# Patient Record
Sex: Female | Born: 1960 | Race: Black or African American | Hispanic: No | Marital: Married | Smoking: Former smoker
Health system: Southern US, Community
[De-identification: ages and names within clinical notes are randomized; demographics above are authoritative.]

## PROBLEM LIST (undated history)

## (undated) DIAGNOSIS — Z940 Kidney transplant status: Secondary | ICD-10-CM

## (undated) DIAGNOSIS — N289 Disorder of kidney and ureter, unspecified: Secondary | ICD-10-CM

## (undated) DIAGNOSIS — M1990 Unspecified osteoarthritis, unspecified site: Secondary | ICD-10-CM

## (undated) DIAGNOSIS — M109 Gout, unspecified: Secondary | ICD-10-CM

## (undated) DIAGNOSIS — I10 Essential (primary) hypertension: Secondary | ICD-10-CM

## (undated) DIAGNOSIS — G8929 Other chronic pain: Secondary | ICD-10-CM

## (undated) DIAGNOSIS — M549 Dorsalgia, unspecified: Secondary | ICD-10-CM

## (undated) HISTORY — PX: EYE SURGERY: SHX253

## (undated) HISTORY — PX: OTHER SURGICAL HISTORY: SHX169

## (undated) HISTORY — PX: AV FISTULA PLACEMENT: SHX1204

## (undated) HISTORY — PX: ABDOMINAL HYSTERECTOMY: SHX81

## (undated) HISTORY — PX: KIDNEY SURGERY: SHX687

## (undated) HISTORY — PX: KIDNEY TRANSPLANT: SHX239

---

## 1998-02-24 ENCOUNTER — Emergency Department (HOSPITAL_COMMUNITY): Admission: EM | Admit: 1998-02-24 | Discharge: 1998-02-24 | Payer: Self-pay | Admitting: Emergency Medicine

## 2002-01-13 ENCOUNTER — Encounter: Payer: Self-pay | Admitting: Cardiovascular Disease

## 2002-01-13 ENCOUNTER — Ambulatory Visit (HOSPITAL_COMMUNITY): Admission: RE | Admit: 2002-01-13 | Discharge: 2002-01-13 | Payer: Self-pay | Admitting: Family Medicine

## 2003-11-05 ENCOUNTER — Emergency Department (HOSPITAL_COMMUNITY): Admission: EM | Admit: 2003-11-05 | Discharge: 2003-11-05 | Payer: Self-pay | Admitting: Family Medicine

## 2003-12-11 ENCOUNTER — Encounter: Admission: RE | Admit: 2003-12-11 | Discharge: 2003-12-11 | Payer: Self-pay | Admitting: Family Medicine

## 2006-06-25 ENCOUNTER — Emergency Department (HOSPITAL_COMMUNITY): Admission: EM | Admit: 2006-06-25 | Discharge: 2006-06-25 | Payer: Self-pay | Admitting: Emergency Medicine

## 2008-06-23 ENCOUNTER — Emergency Department (HOSPITAL_COMMUNITY): Admission: EM | Admit: 2008-06-23 | Discharge: 2008-06-23 | Payer: Self-pay | Admitting: Emergency Medicine

## 2011-01-04 ENCOUNTER — Other Ambulatory Visit: Payer: Self-pay | Admitting: Family Medicine

## 2011-01-04 DIAGNOSIS — Z1231 Encounter for screening mammogram for malignant neoplasm of breast: Secondary | ICD-10-CM

## 2011-01-07 ENCOUNTER — Ambulatory Visit
Admission: RE | Admit: 2011-01-07 | Discharge: 2011-01-07 | Disposition: A | Discharge status: Home / Self Care | Payer: Medicare Other | Source: Ambulatory Visit | Attending: Family Medicine | Admitting: Family Medicine

## 2011-01-07 DIAGNOSIS — Z1231 Encounter for screening mammogram for malignant neoplasm of breast: Secondary | ICD-10-CM

## 2013-01-04 ENCOUNTER — Encounter (HOSPITAL_COMMUNITY): Payer: Self-pay | Admitting: *Deleted

## 2013-01-04 ENCOUNTER — Emergency Department (HOSPITAL_COMMUNITY)
Admission: EM | Admit: 2013-01-04 | Discharge: 2013-01-05 | Disposition: A | Discharge status: Home / Self Care | Payer: Medicare Other | Attending: Emergency Medicine | Admitting: Emergency Medicine

## 2013-01-04 ENCOUNTER — Emergency Department (HOSPITAL_COMMUNITY): Payer: Medicare Other

## 2013-01-04 DIAGNOSIS — Y9389 Activity, other specified: Secondary | ICD-10-CM | POA: Insufficient documentation

## 2013-01-04 DIAGNOSIS — S59909A Unspecified injury of unspecified elbow, initial encounter: Secondary | ICD-10-CM | POA: Insufficient documentation

## 2013-01-04 DIAGNOSIS — S6990XA Unspecified injury of unspecified wrist, hand and finger(s), initial encounter: Secondary | ICD-10-CM | POA: Insufficient documentation

## 2013-01-04 DIAGNOSIS — Z79899 Other long term (current) drug therapy: Secondary | ICD-10-CM | POA: Insufficient documentation

## 2013-01-04 DIAGNOSIS — S46909A Unspecified injury of unspecified muscle, fascia and tendon at shoulder and upper arm level, unspecified arm, initial encounter: Secondary | ICD-10-CM | POA: Insufficient documentation

## 2013-01-04 DIAGNOSIS — Z87448 Personal history of other diseases of urinary system: Secondary | ICD-10-CM | POA: Insufficient documentation

## 2013-01-04 DIAGNOSIS — G8929 Other chronic pain: Secondary | ICD-10-CM | POA: Insufficient documentation

## 2013-01-04 DIAGNOSIS — Y92410 Unspecified street and highway as the place of occurrence of the external cause: Secondary | ICD-10-CM | POA: Insufficient documentation

## 2013-01-04 DIAGNOSIS — S4980XA Other specified injuries of shoulder and upper arm, unspecified arm, initial encounter: Secondary | ICD-10-CM | POA: Insufficient documentation

## 2013-01-04 DIAGNOSIS — M549 Dorsalgia, unspecified: Secondary | ICD-10-CM | POA: Insufficient documentation

## 2013-01-04 DIAGNOSIS — I10 Essential (primary) hypertension: Secondary | ICD-10-CM | POA: Insufficient documentation

## 2013-01-04 HISTORY — DX: Other chronic pain: G89.29

## 2013-01-04 HISTORY — DX: Dorsalgia, unspecified: M54.9

## 2013-01-04 HISTORY — DX: Disorder of kidney and ureter, unspecified: N28.9

## 2013-01-04 HISTORY — DX: Essential (primary) hypertension: I10

## 2013-01-04 MED ORDER — IBUPROFEN 800 MG PO TABS
800.0000 mg | ORAL_TABLET | ORAL | Status: AC
  Administered 2013-01-05: 800 mg via ORAL
  Filled 2013-01-04: qty 1

## 2013-01-04 NOTE — ED Notes (Signed)
Patient transported to X-ray 
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
## 2013-02-16 ENCOUNTER — Other Ambulatory Visit: Payer: Self-pay

## 2013-02-16 DIAGNOSIS — Z1231 Encounter for screening mammogram for malignant neoplasm of breast: Secondary | ICD-10-CM

## 2013-02-23 ENCOUNTER — Ambulatory Visit: Payer: Medicare Other

## 2013-02-25 ENCOUNTER — Ambulatory Visit: Payer: Medicare Other

## 2013-03-04 ENCOUNTER — Ambulatory Visit
Admission: RE | Admit: 2013-03-04 | Discharge: 2013-03-04 | Disposition: A | Discharge status: Home / Self Care | Payer: Medicare Other | Source: Ambulatory Visit

## 2013-03-04 DIAGNOSIS — Z1231 Encounter for screening mammogram for malignant neoplasm of breast: Secondary | ICD-10-CM

## 2013-03-08 ENCOUNTER — Other Ambulatory Visit: Payer: Self-pay | Admitting: Family Medicine

## 2013-03-08 DIAGNOSIS — R928 Other abnormal and inconclusive findings on diagnostic imaging of breast: Secondary | ICD-10-CM

## 2013-03-11 ENCOUNTER — Ambulatory Visit
Admission: RE | Admit: 2013-03-11 | Discharge: 2013-03-11 | Disposition: A | Discharge status: Home / Self Care | Payer: Medicare Other | Source: Ambulatory Visit | Attending: Family Medicine | Admitting: Family Medicine

## 2013-03-11 DIAGNOSIS — R928 Other abnormal and inconclusive findings on diagnostic imaging of breast: Secondary | ICD-10-CM

## 2013-09-29 ENCOUNTER — Ambulatory Visit
Admission: RE | Admit: 2013-09-29 | Discharge: 2013-09-29 | Disposition: A | Discharge status: Home / Self Care | Payer: Medicare Other | Source: Ambulatory Visit | Attending: Pain Medicine | Admitting: Pain Medicine

## 2013-09-29 ENCOUNTER — Other Ambulatory Visit: Payer: Self-pay | Admitting: Pain Medicine

## 2013-09-29 DIAGNOSIS — M542 Cervicalgia: Secondary | ICD-10-CM

## 2013-09-29 DIAGNOSIS — M549 Dorsalgia, unspecified: Secondary | ICD-10-CM

## 2014-03-04 ENCOUNTER — Encounter: Payer: Self-pay | Admitting: *Deleted

## 2014-03-22 DIAGNOSIS — M549 Dorsalgia, unspecified: Secondary | ICD-10-CM | POA: Insufficient documentation

## 2015-05-04 ENCOUNTER — Other Ambulatory Visit: Payer: Self-pay | Admitting: Pain Medicine

## 2015-05-04 DIAGNOSIS — M25511 Pain in right shoulder: Secondary | ICD-10-CM

## 2015-06-08 ENCOUNTER — Other Ambulatory Visit: Payer: Medicare Other

## 2015-07-17 ENCOUNTER — Encounter (HOSPITAL_COMMUNITY): Payer: Self-pay | Admitting: Emergency Medicine

## 2015-07-17 ENCOUNTER — Emergency Department (HOSPITAL_COMMUNITY)
Admission: EM | Admit: 2015-07-17 | Discharge: 2015-07-17 | Disposition: A | Discharge status: Home / Self Care | Payer: Medicare Other | Attending: Emergency Medicine | Admitting: Emergency Medicine

## 2015-07-17 DIAGNOSIS — G8929 Other chronic pain: Secondary | ICD-10-CM | POA: Insufficient documentation

## 2015-07-17 DIAGNOSIS — Z87448 Personal history of other diseases of urinary system: Secondary | ICD-10-CM | POA: Insufficient documentation

## 2015-07-17 DIAGNOSIS — Z79899 Other long term (current) drug therapy: Secondary | ICD-10-CM | POA: Diagnosis not present

## 2015-07-17 DIAGNOSIS — M545 Low back pain, unspecified: Secondary | ICD-10-CM

## 2015-07-17 DIAGNOSIS — Z87891 Personal history of nicotine dependence: Secondary | ICD-10-CM | POA: Diagnosis not present

## 2015-07-17 DIAGNOSIS — I10 Essential (primary) hypertension: Secondary | ICD-10-CM | POA: Diagnosis not present

## 2015-07-17 MED ORDER — PREDNISONE 20 MG PO TABS: 60.0000 mg | ORAL_TABLET | Freq: Every day | ORAL | Status: DC

## 2015-07-17 NOTE — ED Provider Notes (Signed)
CSN: [id number]     Arrival date & time 07/17/15  1048 History   First MD Initiated Contact with Patient 07/17/15 1051     Chief Complaint  Patient presents with  . Joint Pain    3 week hx of joint pain     (Consider location/radiation/quality/duration/timing/severity/associated sxs/prior Treatment) HPI Comments: Patient presents today with a chief complaint of lower back pain.  She reports that the pain has been present for the past 10-15 years, but worse over the past 3 weeks.  Pain radiates down the back of both legs.  She reports that the pain is similar to previous pain.  No acute injury or trauma.  She is followed by Pain Management.  She reports that she has been taking Aleve and Oxycontin for the pain without relief.  She denies bowel or bladder incontinence.  Denies numbness, tingling, fever, or chills.  She states that she has an appointment with her PCP in 3 days and with Pain Management next week..  No history of Cancer or IVDU.  The history is provided by the patient.    Past Medical History  Diagnosis Date  . Renal disorder   . Chronic back pain   . Hypertension    Past Surgical History  Procedure Laterality Date  . Av fistula placement      4 years ago  . Kidney surgery      bilateral kidney removal 2 years ago  . Eye surgery    . Abdominal hysterectomy     Family History  Problem Relation Age of Onset  . Cancer Mother   . Hypertension Mother   . Diabetes Mother   . Hypertension Father    Social History  Substance Use Topics  . Smoking status: Former Research scientist (life sciences)  . Smokeless tobacco: None  . Alcohol Use: No   OB History    No data available     Review of Systems  All other systems reviewed and are negative.     Allergies  Shellfish allergy and Contrast media  Home Medications   Prior to Admission medications   Medication Sig Start Date End Date Taking? Authorizing Provider  calcium acetate (PHOSLO) 667 MG capsule Take 3,335 mg by mouth 2 (two)  times daily.     Historical Provider, MD  ferrous sulfate 325 (65 FE) MG EC tablet Take 650 mg by mouth 2 (two) times daily.    Historical Provider, MD  furosemide (LASIX) 40 MG tablet Take 40 mg by mouth daily.    Historical Provider, MD  HYDROcodone-acetaminophen (NORCO) 10-325 MG per tablet Take 1 tablet by mouth every 8 (eight) hours as needed for pain.    Historical Provider, MD  labetalol (NORMODYNE) 200 MG tablet Take 200 mg by mouth 2 (two) times daily.    Historical Provider, MD   BP 104/78 mmHg  Pulse 79  Temp(Src) 98.3 F (36.8 C) (Oral)  Resp 18  Wt 87.091 kg  SpO2 100% Physical Exam  Constitutional: She appears well-developed and well-nourished.  HENT:  Head: Normocephalic and atraumatic.  Mouth/Throat: Oropharynx is clear and moist.  Neck: Normal range of motion. Neck supple.  Cardiovascular: Normal rate, regular rhythm and normal heart sounds.   Pulses:      Dorsalis pedis pulses are 2+ on the right side, and 2+ on the left side.  Pulmonary/Chest: Effort normal and breath sounds normal.  Musculoskeletal: Normal range of motion.       Lumbar back: She exhibits tenderness and bony tenderness.  She exhibits no swelling, no edema and no deformity.  Neurological: She is alert. She has normal strength.  Reflex Scores:      Patellar reflexes are 2+ on the right side and 2+ on the left side. Distal sensation of both feet intact  Skin: Skin is warm and dry.  Psychiatric: She has a normal mood and affect.  Nursing note and vitals reviewed.   ED Course  Procedures (including critical care time) Labs Review Labs Reviewed - No data to display  Imaging Review No results found. I have personally reviewed and evaluated these images and lab results as part of my medical decision-making.   EKG Interpretation None      MDM   Final diagnoses:  None   Patient with chronic back pain.  No acute injury or trauma.  No neurological deficits and normal neuro exam.  Patient can  walk but states is painful.  No loss of bowel or bladder control.  No concern for cauda equina.  No fever, night sweats, weight loss, h/o cancer, IVDU.  Patient stable for discharge.  Patient instructed to follow up with PCP and Pain management as scheduled.  Stable for discharge.  Return precautions given.     Hyman Bible, PA-C 07/17/15 2026  Lacretia Leigh, MD 07/18/15 [phone]

## 2015-07-17 NOTE — ED Notes (Signed)
Pt reports hx of chronic joint pain. Hx of treatment for long term arthritis pain. C/o 3 week hx of increased pain.

## 2017-05-13 ENCOUNTER — Other Ambulatory Visit: Payer: Self-pay | Admitting: Specialist

## 2017-05-13 DIAGNOSIS — Z1231 Encounter for screening mammogram for malignant neoplasm of breast: Secondary | ICD-10-CM

## 2017-05-16 ENCOUNTER — Ambulatory Visit
Admission: RE | Admit: 2017-05-16 | Discharge: 2017-05-16 | Disposition: A | Discharge status: Home / Self Care | Payer: Medicare HMO | Source: Ambulatory Visit | Attending: Specialist | Admitting: Specialist

## 2017-05-16 DIAGNOSIS — Z1231 Encounter for screening mammogram for malignant neoplasm of breast: Secondary | ICD-10-CM

## 2017-05-19 ENCOUNTER — Other Ambulatory Visit: Payer: Self-pay | Admitting: Specialist

## 2017-05-19 DIAGNOSIS — R928 Other abnormal and inconclusive findings on diagnostic imaging of breast: Secondary | ICD-10-CM

## 2017-05-27 ENCOUNTER — Other Ambulatory Visit: Payer: Medicare HMO

## 2017-06-02 ENCOUNTER — Ambulatory Visit
Admission: RE | Admit: 2017-06-02 | Discharge: 2017-06-02 | Disposition: A | Discharge status: Home / Self Care | Payer: Medicare HMO | Source: Ambulatory Visit | Attending: Specialist | Admitting: Specialist

## 2017-06-02 ENCOUNTER — Ambulatory Visit: Payer: Medicare HMO

## 2017-06-02 DIAGNOSIS — R928 Other abnormal and inconclusive findings on diagnostic imaging of breast: Secondary | ICD-10-CM

## 2018-03-19 ENCOUNTER — Encounter: Payer: Self-pay | Admitting: Podiatry

## 2018-03-19 ENCOUNTER — Ambulatory Visit: Payer: Self-pay

## 2018-03-19 ENCOUNTER — Ambulatory Visit: Payer: Medicare HMO | Admitting: Podiatry

## 2018-03-19 VITALS — BP 132/77 | HR 78

## 2018-03-19 DIAGNOSIS — I739 Peripheral vascular disease, unspecified: Secondary | ICD-10-CM | POA: Diagnosis not present

## 2018-03-19 DIAGNOSIS — M79674 Pain in right toe(s): Secondary | ICD-10-CM

## 2018-03-19 DIAGNOSIS — L603 Nail dystrophy: Secondary | ICD-10-CM | POA: Diagnosis not present

## 2018-03-19 DIAGNOSIS — S99921A Unspecified injury of right foot, initial encounter: Secondary | ICD-10-CM

## 2018-03-19 MED ORDER — CEPHALEXIN 500 MG PO CAPS: 500.0000 mg | ORAL_CAPSULE | Freq: Three times a day (TID) | ORAL | 0 refills | Status: DC

## 2018-03-19 MED ORDER — DOXYCYCLINE HYCLATE 100 MG PO TABS: 100.0000 mg | ORAL_TABLET | Freq: Two times a day (BID) | ORAL | 0 refills | Status: DC

## 2018-03-19 NOTE — Patient Instructions (Signed)

## 2018-03-23 NOTE — Progress Notes (Signed)
Subjective:   Patient ID: Lynn Gregory, female   DOB: 57 y.o.   MRN: [id number]   HPI 57 year old female presents the office today for concerns of her right big toenail becoming very thickened discolored and the area is painful she is asking for the nail removed currently.  She states this started after she dropped a can on her toe several years ago and the nail has come back and thickened discolored.  She denies any redness or drainage or any swelling to the toenail site.  She has no other concerns.   Review of Systems  All other systems reviewed and are negative.  Past Medical History:  Diagnosis Date  . Chronic back pain   . Hypertension   . Renal disorder     Past Surgical History:  Procedure Laterality Date  . ABDOMINAL HYSTERECTOMY    . AV FISTULA PLACEMENT     4 years ago  . EYE SURGERY    . KIDNEY SURGERY     bilateral kidney removal 2 years ago     Current Outpatient Medications:  .  acetaminophen (TYLENOL) 500 MG tablet, Take by mouth., Disp: , Rfl:  .  amitriptyline (ELAVIL) 10 MG tablet, , Disp: , Rfl:  .  amLODipine (NORVASC) 10 MG tablet, , Disp: , Rfl:  .  aspirin EC 81 MG tablet, Take by mouth., Disp: , Rfl:  .  calcium acetate (PHOSLO) 667 MG capsule, Take 3,335 mg by mouth 2 (two) times daily. , Disp: , Rfl:  .  carvedilol (COREG) 25 MG tablet, , Disp: , Rfl:  .  cloNIDine (CATAPRES) 0.2 MG tablet, , Disp: , Rfl:  .  diclofenac sodium (VOLTAREN) 1 % GEL, APPLY 2 4 GRAMS TO PAINFUL AREA OF SKIN 2 4 TIMES PER DAY IF TOLERATED, Disp: , Rfl: 0 .  DOCUSATE SODIUM PO, Take by mouth., Disp: , Rfl:  .  DULoxetine (CYMBALTA) 30 MG capsule, , Disp: , Rfl:  .  ferrous sulfate 325 (65 FE) MG tablet, Take by mouth., Disp: , Rfl:  .  furosemide (LASIX) 40 MG tablet, Take by mouth., Disp: , Rfl:  .  HYDROcodone-acetaminophen (NORCO) 10-325 MG tablet, Take by mouth., Disp: , Rfl:  .  ipratropium (ATROVENT) 0.03 % nasal spray, USE 2 SPRAY(S) IN EACH NOSTRIL TWICE DAILY,  Disp: , Rfl: 6 .  iron polysaccharides (NIFEREX) 150 MG capsule, Take by mouth., Disp: , Rfl:  .  labetalol (NORMODYNE) 300 MG tablet, Take 600 mg by mouth 2 (two) times daily., Disp: , Rfl: 3 .  magnesium oxide (MAG-OX) 400 (241.3 Mg) MG tablet, Take 1 tablet by mouth daily., Disp: , Rfl: 2 .  metoprolol succinate (TOPROL-XL) 50 MG 24 hr tablet, Take by mouth., Disp: , Rfl:  .  mycophenolate (MYFORTIC) 180 MG EC tablet, Take 720 mg by mouth 2 (two) times daily., Disp: , Rfl: 5 .  Oxycodone HCl 10 MG TABS, , Disp: , Rfl:  .  oxyCODONE-acetaminophen (PERCOCET) 10-325 MG tablet, , Disp: , Rfl:  .  potassium chloride (MICRO-K) 10 MEQ CR capsule, Take by mouth., Disp: , Rfl:  .  predniSONE (DELTASONE) 5 MG tablet, Take by mouth., Disp: , Rfl:  .  pyridOXINE (VITAMIN B-6) 100 MG tablet, Take by mouth., Disp: , Rfl:  .  sevelamer carbonate (RENVELA) 800 MG tablet, 1 tab by mouth 3 times a day with meals-1200mg  tabs, Disp: , Rfl:  .  sodium bicarbonate 325 MG tablet, Take by mouth., Disp: , Rfl:  .  sulfamethoxazole-trimethoprim (BACTRIM,SEPTRA) 400-80 MG tablet, TAKE 1 TABLET BY MOUTH ON MONDAY WEDNESDAY AND FRIDAY, Disp: , Rfl: 5 .  tacrolimus (PROGRAF) 1 MG capsule, TAKE 2 CAPSULES BY MOUTH IN THE AM AND TAKE 3 CAPSULES BY MOUTH IN THE PM, Disp: , Rfl: 5 .  doxycycline (VIBRA-TABS) 100 MG tablet, Take 1 tablet (100 mg total) by mouth 2 (two) times daily., Disp: 20 tablet, Rfl: 0  Allergies  Allergen Reactions  . Iodinated Diagnostic Agents Itching, Rash and Swelling  . Other Itching, Rash and Swelling    Rash itching with x ray dye, and IV dye Patient husband states patient is allergic to all dyes.   . Shellfish Allergy Other (See Comments) and Swelling    Lower extremities swelling  . Metrizamide Itching         Objective:  Physical Exam  General: AAO x3, NAD  Dermatological: The right hallux toenail significantly hypertrophic, dystrophic ill-defined discoloration.  There is  tenderness the entire toenail.  No surrounding erythema, ascending cellulitis.  There is no fluctuation or crepitation or any malodor.  No open lesions otherwise.  Vascular: Dorsalis Pedis artery and Posterior Tibial artery pedal pulses are 2/4 bilateral with immedate capillary fill time.  There is no pain with calf compression, swelling, warmth, erythema.   Neruologic: Grossly intact via light touch bilateral.  Protective threshold with Semmes Wienstein monofilament intact to all pedal sites bilateral.   Musculoskeletal: No gross boney pedal deformities bilateral. No pain, crepitus, or limitation noted with foot and ankle range of motion bilateral. Muscular strength 5/5 in all groups tested bilateral.  Gait: Unassisted, Nonantalgic.      Assessment:   Assessment right hallux onychodystrophy     Plan:  -Treatment options discussed including all alternatives, risks, and complications -Etiology of symptoms were discussed -I did an ABI in the office to ensure healing.  Left was 1.26 and the right was 1.24. -At this time, the patient is requesting total nail removal with chemical matricectomy to the nail. Risks and complications were discussed with the patient for which they understand and written consent was obtained. Under sterile conditions a total of 3 mL of a mixture of 2% lidocaine plain and 0.5% Marcaine plain was infiltrated in a hallux block fashion. Once anesthetized, the skin was prepped in sterile fashion. A tourniquet was then applied. Next the right hallux toenail was then sharply excised making sure to remove the entire offending nail border. Once the nails were ensured to be removed area was debrided and the underlying skin was intact. There is no purulence identified in the procedure. Next phenol was then applied under standard conditions and copiously irrigated. Silvadene was applied. A dry sterile dressing was applied. After application of the dressing the tourniquet was removed  and there is found to be an immediate capillary refill time to the digit. The patient tolerated the procedure well any complications. Post procedure instructions were discussed the patient for which he verbally understood. Follow-up in one week for nail check or sooner if any problems are to arise. Discussed signs/symptoms of infection and directed to call the office immediately should any occur or go directly to the emergency room. In the meantime, encouraged to call the office with any questions, concerns, changes symptoms. -Doxycycline  Trula Slade DPM

## 2018-03-26 ENCOUNTER — Ambulatory Visit: Payer: Self-pay

## 2018-03-26 DIAGNOSIS — L603 Nail dystrophy: Secondary | ICD-10-CM

## 2018-03-26 NOTE — Patient Instructions (Signed)

## 2018-03-27 NOTE — Progress Notes (Signed)
Patient is here today for follow-up appointment, recent procedure performed 03/19/2018, removal of right hallux nail.  She says that overall her toe feels good, she continues to soak up the Epson salts burns at times.  No redness, no erythema, very small amount of serosanguineous drainage.  No swelling no other indications of infection.  Overall area appears to be healing well.  Advised to continue soaking toe, continue to bandage during the day, and take bandage off at night.  We also discussed signs and symptoms of infection and importance of reporting signs immediately.  Verbal and written instructions were given to the patient.  She is to follow-up with any acute symptom changes.

## 2018-04-17 ENCOUNTER — Other Ambulatory Visit: Payer: Self-pay | Admitting: Podiatry

## 2018-09-17 ENCOUNTER — Other Ambulatory Visit: Payer: Self-pay | Admitting: Specialist

## 2018-09-17 DIAGNOSIS — Z1231 Encounter for screening mammogram for malignant neoplasm of breast: Secondary | ICD-10-CM

## 2018-10-23 ENCOUNTER — Ambulatory Visit: Payer: Medicare HMO

## 2018-12-08 ENCOUNTER — Other Ambulatory Visit: Payer: Self-pay

## 2018-12-08 ENCOUNTER — Ambulatory Visit
Admission: RE | Admit: 2018-12-08 | Discharge: 2018-12-08 | Disposition: A | Discharge status: Home / Self Care | Payer: Medicare HMO | Source: Ambulatory Visit | Attending: Specialist | Admitting: Specialist

## 2018-12-08 DIAGNOSIS — Z1231 Encounter for screening mammogram for malignant neoplasm of breast: Secondary | ICD-10-CM

## 2018-12-09 ENCOUNTER — Other Ambulatory Visit: Payer: Self-pay | Admitting: Specialist

## 2018-12-09 DIAGNOSIS — R928 Other abnormal and inconclusive findings on diagnostic imaging of breast: Secondary | ICD-10-CM

## 2018-12-14 ENCOUNTER — Other Ambulatory Visit: Payer: Self-pay

## 2018-12-14 ENCOUNTER — Other Ambulatory Visit: Payer: Self-pay | Admitting: Family

## 2018-12-14 ENCOUNTER — Ambulatory Visit: Payer: Medicare HMO

## 2018-12-14 ENCOUNTER — Ambulatory Visit
Admission: RE | Admit: 2018-12-14 | Discharge: 2018-12-14 | Disposition: A | Discharge status: Home / Self Care | Payer: Medicare HMO | Source: Ambulatory Visit | Attending: Specialist | Admitting: Specialist

## 2018-12-14 DIAGNOSIS — R928 Other abnormal and inconclusive findings on diagnostic imaging of breast: Secondary | ICD-10-CM

## 2019-01-05 ENCOUNTER — Other Ambulatory Visit: Payer: Self-pay | Admitting: Surgery

## 2019-01-20 NOTE — Progress Notes (Signed)
[place], [place] - [address] [place] [address] [place] [address] Phone: [phone] Fax: [phone]  [facility] [address] [place] [address], [place] [address] Phone: [phone] Fax: [phone]  [place], [place] [address] Phone: [phone] Fax: [phone]      Your procedure is scheduled on July 29  Report to [facility] Main Entrance "A" at 1100 A.M., and check in at the Admitting office.  Call this number if you have problems the morning of surgery:  [phone]  Call [phone] if you have any questions prior to your surgery date Monday-Friday 8am-4pm    Remember:  Do not eat after midnight the night before your surgery  You may drink clear liquids until 1000 am the morning of your surgery.   Clear liquids allowed are: Water, Non-Citrus Juices (without pulp), Carbonated Beverages, Clear Tea, Black Coffee Only, and Gatorade    Take these medicines the morning of surgery with A SIP OF WATER  acetaminophen (TYLENOL)  If needed amitriptyline (ELAVIL) amLODipine (NORVASC) carvedilol (COREG)  cloNIDine (CATAPRES) doxycycline (VIBRA-TABS)  DULoxetine (CYMBALTA) HYDROcodone-acetaminophen (NORCO)  If needed ipratropium (ATROVENT) labetalol (NORMODYNE) metoprolol succinate (TOPROL-XL)  mycophenolate (MYFORTIC)  tacrolimus (PROGRAF)  Follow your surgeon's instructions on when to stop Aspirin.  If no instructions were given by your surgeon then you will need to call the office to get those instructions.     7 days prior to surgery STOP taking any Aspirin (unless otherwise instructed by your surgeon), Aleve, Naproxen, Ibuprofen, Motrin, Advil, Goody's, BC's, all herbal medications, fish oil, and all vitamins.    The Morning of Surgery  Do not wear jewelry, make-up or nail polish.  Do  not wear lotions, powders, or perfumes/colognes, or deodorant  Do not shave 48 hours prior to surgery.   Do not bring valuables to the hospital.  [facility] is not responsible for any belongings or valuables.  If you are a smoker, DO NOT Smoke 24 hours prior to surgery IF you wear a CPAP at night please bring your mask, tubing, and machine the morning of surgery   Remember that you must have someone to transport you home after your surgery, and remain with you for 24 hours if you are discharged the same day.   Contacts, glasses, hearing aids, dentures or bridgework may not be worn into surgery.    Leave your suitcase in the car.  After surgery it may be brought to your room.  For patients admitted to the hospital, discharge time will be determined by your treatment team.  Patients discharged the day of surgery will not be allowed to drive home.    Special instructions:   [place]- Preparing For Surgery  Before surgery, you can play an important role. Because skin is not sterile, your skin needs to be as free of germs as possible. You can reduce the number of germs on your skin by washing with CHG (chlorahexidine gluconate) Soap before surgery.  CHG is an antiseptic cleaner which kills germs and bonds with the skin to continue killing germs even after washing.    Oral Hygiene is also important to reduce your risk of infection.  Remember - BRUSH YOUR TEETH THE MORNING OF SURGERY WITH YOUR REGULAR TOOTHPASTE  Please do not use if you have an allergy to CHG or antibacterial soaps. If your skin becomes reddened/irritated stop  using the CHG.  Do not shave (including legs and underarms) for at least 48 hours prior to first CHG shower. It is OK to shave your face.  Please follow these instructions carefully.   1. Shower the NIGHT BEFORE SURGERY and the MORNING OF SURGERY with CHG Soap.   2. If you chose to wash your hair, wash your hair first as usual with your normal  shampoo.  3. After you shampoo, rinse your hair and body thoroughly to remove the shampoo.  4. Use CHG as you would any other liquid soap. You can apply CHG directly to the skin and wash gently with a scrungie or a clean washcloth.   5. Apply the CHG Soap to your body ONLY FROM THE NECK DOWN.  Do not use on open wounds or open sores. Avoid contact with your eyes, ears, mouth and genitals (private parts). Wash Face and genitals (private parts)  with your normal soap.   6. Wash thoroughly, paying special attention to the area where your surgery will be performed.  7. Thoroughly rinse your body with warm water from the neck down.  8. DO NOT shower/wash with your normal soap after using and rinsing off the CHG Soap.  9. Pat yourself dry with a CLEAN TOWEL.  10. Wear CLEAN PAJAMAS to bed the night before surgery, wear comfortable clothes the morning of surgery  11. Place CLEAN SHEETS on your bed the night of your first shower and DO NOT SLEEP WITH PETS.    Day of Surgery:  Do not apply any deodorants/lotions. Please shower the morning of surgery with the CHG soap  Please wear clean clothes to the hospital/surgery center.   Remember to brush your teeth WITH YOUR REGULAR TOOTHPASTE.   Please read over the following fact sheets that you were given.

## 2019-01-21 ENCOUNTER — Encounter (HOSPITAL_COMMUNITY): Payer: Self-pay

## 2019-01-21 ENCOUNTER — Encounter (HOSPITAL_COMMUNITY)
Admission: RE | Admit: 2019-01-21 | Discharge: 2019-01-21 | Disposition: A | Discharge status: Home / Self Care | Payer: Medicare HMO | Source: Ambulatory Visit | Attending: Orthopaedic Surgery | Admitting: Orthopaedic Surgery

## 2019-01-21 ENCOUNTER — Other Ambulatory Visit: Payer: Self-pay

## 2019-01-21 DIAGNOSIS — Z01818 Encounter for other preprocedural examination: Secondary | ICD-10-CM | POA: Insufficient documentation

## 2019-01-21 HISTORY — DX: Unspecified osteoarthritis, unspecified site: M19.90

## 2019-01-21 HISTORY — DX: Gout, unspecified: M10.9

## 2019-01-21 HISTORY — DX: Kidney transplant status: Z94.0

## 2019-01-21 LAB — BASIC METABOLIC PANEL
Anion gap: 10 (ref 5–15)
BUN: 17 mg/dL (ref 6–20)
CO2: 27 mmol/L (ref 22–32)
Calcium: 9.8 mg/dL (ref 8.9–10.3)
Chloride: 103 mmol/L (ref 98–111)
Creatinine, Ser: 1.21 mg/dL — ABNORMAL HIGH (ref 0.44–1.00)
GFR calc Af Amer: 57 mL/min — ABNORMAL LOW (ref >60)
GFR calc non Af Amer: 49 mL/min — ABNORMAL LOW (ref >60)
Glucose, Bld: 122 mg/dL — ABNORMAL HIGH (ref 70–99)
Potassium: 4.3 mmol/L (ref 3.5–5.1)
Sodium: 140 mmol/L (ref 135–145)

## 2019-01-21 LAB — CBC
HCT: 38.2 % (ref 36.0–46.0)
Hemoglobin: 12.9 g/dL (ref 12.0–15.0)
MCH: 29.8 pg (ref 26.0–34.0)
MCHC: 33.8 g/dL (ref 30.0–36.0)
MCV: 88.2 fL (ref 80.0–100.0)
Platelets: 291 10*3/uL (ref 150–400)
RBC: 4.33 MIL/uL (ref 3.87–5.11)
RDW: 14.2 % (ref 11.5–15.5)
WBC: 4.8 10*3/uL (ref 4.0–10.5)
nRBC: 0 % (ref 0.0–0.2)

## 2019-01-21 NOTE — Progress Notes (Signed)
PCP - Lillia Corporal Cardiologist - denies  Chest x-ray - not needed EKG - 01/21/19 Stress Test - 2017 ECHO - 2017 Cardiac Cath - denies  Aspirin Instructions: patient was told to continue asa  Anesthesia review: yes, hx kidney transplant  Patient denies shortness of breath, fever, cough and chest pain at PAT appointment   Patient verbalized understanding of instructions that were given to them at the PAT appointment. Patient was also instructed that they will need to review over the PAT instructions again at home before surgery.

## 2019-01-22 NOTE — Anesthesia Preprocedure Evaluation (Addendum)
Anesthesia Evaluation  Patient identified by MRN, date of birth, ID band Patient awake    Reviewed: Allergy & Precautions, NPO status , Patient's Chart, lab work & pertinent test results  Airway Mallampati: II  TM Distance: >3 FB Neck ROM: Full    Dental  (+) Partial Upper, Teeth Intact, Dental Advisory Given   Pulmonary former smoker,    breath sounds clear to auscultation       Cardiovascular hypertension,  Rhythm:Regular Rate:Normal     Neuro/Psych    GI/Hepatic   Endo/Other    Renal/GU      Musculoskeletal   Abdominal   Peds  Hematology   Anesthesia Other Findings   Reproductive/Obstetrics                            Anesthesia Physical Anesthesia Plan  ASA: III  Anesthesia Plan: MAC   Post-op Pain Management:    Induction: Intravenous  PONV Risk Score and Plan: Ondansetron and Dexamethasone  Airway Management Planned: Natural Airway and Simple Face Mask  Additional Equipment:   Intra-op Plan:   Post-operative Plan:   Informed Consent: I have reviewed the patients History and Physical, chart, labs and discussed the procedure including the risks, benefits and alternatives for the proposed anesthesia with the patient or authorized representative who has indicated his/her understanding and acceptance.     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 Baseline creatinine now 1.1-1.2.  Stress echo 09/14/15 (care everywhere): SUMMARY The patient had no chest pain during stress The patient achieved 92 % of maximum predicted heart rate. Normal left ventricular function at rest. There were no segmental wall motion abnormalities at rest. There was normal augmentation of all left ventricular  wall segments with  dobutamine. There were no segmental wall motion abnormalities with dobutamine. Negative stress ECG for inducible ischemia at target heart rate. Negative dobutamine echocardiography for inducible ischemia at target heart  rate.  TTE 08/17/15 (care everywhere):  Left ventricular systolic function is normal. LV ejection fraction = 60-65%. The left ventricular wall motion is normal.  Left ventricular filling pattern is normal. The right ventricle is normal in size and function. The left atrium is mildly dilated. No significant valvular stenosis or regurgitation There is no pericardial effusion. Probably no significant change in comparison with the prior study noted)       Anesthesia Quick Evaluation

## 2019-01-23 ENCOUNTER — Other Ambulatory Visit (HOSPITAL_COMMUNITY): Payer: Medicare HMO

## 2019-01-25 ENCOUNTER — Other Ambulatory Visit (HOSPITAL_COMMUNITY)
Admission: RE | Admit: 2019-01-25 | Discharge: 2019-01-25 | Disposition: A | Discharge status: Home / Self Care | Payer: Medicare HMO | Source: Ambulatory Visit | Attending: Surgery | Admitting: Surgery

## 2019-01-25 DIAGNOSIS — Z20828 Contact with and (suspected) exposure to other viral communicable diseases: Secondary | ICD-10-CM | POA: Diagnosis present

## 2019-01-26 LAB — SARS CORONAVIRUS 2 (TAT 6-24 HRS): SARS Coronavirus 2: NEGATIVE

## 2019-01-26 NOTE — H&P (Signed)
Lynn Gregory Documented: 01/05/2019 2:20 PM Location: [facility] Patient #: [phone] DOB: May 04, 1961 Single / Language: Lynn Gregory / Race: Black or African American Female   History of Present Illness (Lynn Gregory A. Ninfa Linden MD; 01/05/2019 2:50 PM) The patient is a 58 year old female who presents with a complaint of Mass. This is a pleasant 58 year old female referred by Lynn Presser FNP for evaluation of a mass on her back. The patient reports the mass of the neck for several years but now continues to get larger and causes discomfort especially when trying to sleep. She has a history of renal failure and has had a kidney transplant and is doing very well from this. She is otherwise without complaints.   Past Surgical History Lynn Gregory, [place]; 01/05/2019 2:20 PM) Breast Biopsy  Bilateral. Dialysis Shunt / Fistula  Nephrectomy  Bilateral.  Diagnostic Studies History Lynn Gregory, Lynn Gregory; 01/05/2019 2:20 PM) Colonoscopy  1-5 years ago Mammogram  1-3 years ago Pap Smear  1-5 years ago  Allergies Lynn Gregory, Lynn Gregory; 01/05/2019 2:20 PM) No Known Allergies  [01/05/2019]: No Known Drug Allergies  [01/05/2019]: Allergies Reconciled   Medication History Lynn Gregory, Lynn Gregory; 01/05/2019 2:24 PM) Aspirin (81MG  Tablet DR, Oral) Active. Calcium 600 (1500 (600 Ca)MG Tablet, Oral) Active. Pyridium (100MG  Tablet, Oral) Active. Doxycycline (40MG  Capsule DR, Oral) Active. Amitriptyline HCl (10MG  Tablet, Oral) Active. Cymbalta (60MG  Capsule DR Part, Oral) Active. Labetalol HCl (100MG  Tablet, Oral) Active. Magnesium (30MG  Tablet, Oral) Active. Prograf (1MG  Packet, Oral) Active. Diclofenac Potassium (50MG  Tablet, Oral) Active. Atrovent HFA (17MCG/ACT Aerosol Soln, Inhalation) Active. Potassium (75MG  Tablet, Oral) Active. Medications Reconciled  Social History ([place], Lynn Gregory; 01/05/2019 2:20 PM) Alcohol use  Occasional alcohol use. Caffeine use  Carbonated  beverages, Coffee, Tea. Illicit drug use  Prefer to discuss with provider. Tobacco use  Former smoker.  Family History Lynn Gregory, [place]; 01/05/2019 2:20 PM) Alcohol Abuse  Brother. Arthritis  Mother. Cerebrovascular Accident  Brother. Cervical Cancer  Mother. Depression  Mother. Diabetes Mellitus  Mother. Heart Disease  Sister. Heart disease in female family member before age 93  Heart disease in female family member before age 39  Hypertension  Brother, Father, Mother, Sister, Son. Kidney Disease  Sister.  Pregnancy / Birth History Lynn Gregory, [place]; 01/05/2019 2:20 PM) Age at menarche  48 years. Irregular periods  Maternal age  37-20 Para  1  Other Problems Lynn Gregory, Lynn Gregory; 01/05/2019 2:20 PM) Arthritis  Back Pain  Chronic Renal Failure Syndrome  High blood pressure  Transfusion history     Review of Systems (Lynn Gregory; 01/05/2019 2:20 PM) General Present- Chills, Night Sweats and Weight Gain. Not Present- Appetite Loss, Fatigue, Fever and Weight Loss. Skin Present- Dryness. Not Present- Change in Wart/Mole, Hives, Jaundice, New Lesions, Non-Healing Wounds, Rash and Ulcer. HEENT Present- Nose Bleed, Ringing in the Ears, Seasonal Allergies and Wears glasses/contact lenses. Not Present- Earache, Hearing Loss, Hoarseness, Oral Ulcers, Sinus Pain, Sore Throat, Visual Disturbances and Yellow Eyes. Cardiovascular Present- Leg Cramps and Swelling of Extremities. Not Present- Chest Pain, Difficulty Breathing Lying Down, Palpitations, Rapid Heart Rate and Shortness of Breath. Gastrointestinal Present- Bloating, Nausea and Vomiting. Not Present- Abdominal Pain, Bloody Stool, Change in Bowel Habits, Chronic diarrhea, Constipation, Difficulty Swallowing, Excessive gas, Gets full quickly at meals, Hemorrhoids, Indigestion and Rectal Pain. Female Genitourinary Present- Frequency and Nocturia. Not Present- Painful Urination, Pelvic Pain and  Urgency. Musculoskeletal Present- Back Pain, Muscle Pain and Swelling of Extremities. Not Present- Joint Pain, Joint Stiffness and Muscle  Weakness. Psychiatric Present- Anxiety and Change in Sleep Pattern. Not Present- Bipolar, Depression, Fearful and Frequent crying. Endocrine Present- Hair Changes and Hot flashes. Not Present- Cold Intolerance, Excessive Hunger, Heat Intolerance and New Diabetes.  Vitals (Lynn Gregory Lynn Gregory; 01/05/2019 2:25 PM) 01/05/2019 2:24 PM Weight: 186 lb Height: 63in Body Surface Area: 1.88 m Body Mass Index: 32.95 kg/m  Temp.: 98.12F (Oral)  Pulse: 93 (Regular)  BP: 136/82(Sitting, Left Arm, Standard)       Physical Exam (Lynn Geno A. Ninfa Linden MD; 01/05/2019 2:50 PM) The physical exam findings are as follows: Note: On examination, she is well appearance Lungs are clear bilaterally Cardiovascular is regular rate and rhythm There is a large 7 cm mass on her upper mid back almost at the midline which is soft but mildly tender    Assessment & Plan (Lynn Gregory A. Ninfa Linden MD; 01/05/2019 2:51 PM)  SUBCUTANEOUS MASS OF BACK (R22.2) Impression: This is quite large mass that is tender. I spent a lipoma although I cannot rule out a desmoid tumor. Surgical excision of the mass is recommended to rule out malignancy. I discussed this procedure with her in detail. This would need to be performed in the operating room. I discussed the risks of surgery which included but is not limited to bleeding, infection, recurrence, injury to surrounding structures, cardiopulmonary issues, etc. She understands and wishes to proceed with surgery which will be scheduled

## 2019-01-27 ENCOUNTER — Other Ambulatory Visit: Payer: Self-pay

## 2019-01-27 ENCOUNTER — Ambulatory Visit (HOSPITAL_COMMUNITY): Payer: Medicare HMO | Admitting: Certified Registered Nurse Anesthetist

## 2019-01-27 ENCOUNTER — Encounter (HOSPITAL_COMMUNITY): Payer: Self-pay | Admitting: Certified Registered Nurse Anesthetist

## 2019-01-27 ENCOUNTER — Encounter (HOSPITAL_COMMUNITY)
Admission: RE | Disposition: A | Discharge status: Home / Self Care | Payer: Self-pay | Source: Home / Self Care | Attending: Surgery

## 2019-01-27 ENCOUNTER — Ambulatory Visit (HOSPITAL_COMMUNITY): Payer: Medicare HMO | Admitting: Physician Assistant

## 2019-01-27 ENCOUNTER — Ambulatory Visit (HOSPITAL_COMMUNITY)
Admission: RE | Admit: 2019-01-27 | Discharge: 2019-01-27 | Disposition: A | Discharge status: Home / Self Care | Payer: Medicare HMO | Attending: Surgery | Admitting: Surgery

## 2019-01-27 DIAGNOSIS — Z79899 Other long term (current) drug therapy: Secondary | ICD-10-CM | POA: Diagnosis not present

## 2019-01-27 DIAGNOSIS — Z940 Kidney transplant status: Secondary | ICD-10-CM | POA: Diagnosis not present

## 2019-01-27 DIAGNOSIS — D171 Benign lipomatous neoplasm of skin and subcutaneous tissue of trunk: Secondary | ICD-10-CM | POA: Diagnosis not present

## 2019-01-27 DIAGNOSIS — N189 Chronic kidney disease, unspecified: Secondary | ICD-10-CM | POA: Diagnosis not present

## 2019-01-27 DIAGNOSIS — Z905 Acquired absence of kidney: Secondary | ICD-10-CM | POA: Diagnosis not present

## 2019-01-27 DIAGNOSIS — I129 Hypertensive chronic kidney disease with stage 1 through stage 4 chronic kidney disease, or unspecified chronic kidney disease: Secondary | ICD-10-CM | POA: Diagnosis not present

## 2019-01-27 DIAGNOSIS — Z7982 Long term (current) use of aspirin: Secondary | ICD-10-CM | POA: Insufficient documentation

## 2019-01-27 DIAGNOSIS — Z87891 Personal history of nicotine dependence: Secondary | ICD-10-CM | POA: Insufficient documentation

## 2019-01-27 DIAGNOSIS — R222 Localized swelling, mass and lump, trunk: Secondary | ICD-10-CM | POA: Diagnosis present

## 2019-01-27 HISTORY — PX: MASS EXCISION: SHX2000

## 2019-01-27 SURGERY — EXCISION MASS
Anesthesia: Monitor Anesthesia Care | Site: Back

## 2019-01-27 MED ORDER — FENTANYL CITRATE (PF) 100 MCG/2ML IJ SOLN
INTRAMUSCULAR | Status: DC | PRN
  Administered 2019-01-27: 50 ug via INTRAVENOUS
  Administered 2019-01-27 (×2): 25 ug via INTRAVENOUS

## 2019-01-27 MED ORDER — SUGAMMADEX SODIUM 500 MG/5ML IV SOLN
INTRAVENOUS | Status: AC
  Filled 2019-01-27: qty 5

## 2019-01-27 MED ORDER — CHLORHEXIDINE GLUCONATE CLOTH 2 % EX PADS: 6.0000 | MEDICATED_PAD | Freq: Once | CUTANEOUS | Status: DC

## 2019-01-27 MED ORDER — BUPIVACAINE-EPINEPHRINE 0.25% -1:200000 IJ SOLN
INTRAMUSCULAR | Status: DC | PRN
  Administered 2019-01-27: 30 mL

## 2019-01-27 MED ORDER — ONDANSETRON HCL 4 MG/2ML IJ SOLN: 4.0000 mg | Freq: Once | INTRAMUSCULAR | Status: DC | PRN

## 2019-01-27 MED ORDER — DEXAMETHASONE SODIUM PHOSPHATE 10 MG/ML IJ SOLN
INTRAMUSCULAR | Status: AC
  Filled 2019-01-27: qty 1

## 2019-01-27 MED ORDER — ACETAMINOPHEN 500 MG PO TABS
1000.0000 mg | ORAL_TABLET | ORAL | Status: AC
  Administered 2019-01-27: 1000 mg via ORAL
  Filled 2019-01-27: qty 2

## 2019-01-27 MED ORDER — ONDANSETRON HCL 4 MG/2ML IJ SOLN
INTRAMUSCULAR | Status: AC
  Filled 2019-01-27: qty 4

## 2019-01-27 MED ORDER — MIDAZOLAM HCL 5 MG/5ML IJ SOLN
INTRAMUSCULAR | Status: DC | PRN
  Administered 2019-01-27: 2 mg via INTRAVENOUS

## 2019-01-27 MED ORDER — FENTANYL CITRATE (PF) 250 MCG/5ML IJ SOLN
INTRAMUSCULAR | Status: AC
  Filled 2019-01-27: qty 5

## 2019-01-27 MED ORDER — 0.9 % SODIUM CHLORIDE (POUR BTL) OPTIME
TOPICAL | Status: DC | PRN
  Administered 2019-01-27: 1000 mL

## 2019-01-27 MED ORDER — LACTATED RINGERS IV SOLN
INTRAVENOUS | Status: DC
  Administered 2019-01-27: 11:00:00 via INTRAVENOUS

## 2019-01-27 MED ORDER — LIDOCAINE 2% (20 MG/ML) 5 ML SYRINGE
INTRAMUSCULAR | Status: AC
  Filled 2019-01-27: qty 10

## 2019-01-27 MED ORDER — SUCCINYLCHOLINE CHLORIDE 200 MG/10ML IV SOSY
PREFILLED_SYRINGE | INTRAVENOUS | Status: AC
  Filled 2019-01-27: qty 10

## 2019-01-27 MED ORDER — OXYCODONE HCL 5 MG PO TABS: 5.0000 mg | ORAL_TABLET | Freq: Once | ORAL | Status: DC | PRN

## 2019-01-27 MED ORDER — MIDAZOLAM HCL 2 MG/2ML IJ SOLN
INTRAMUSCULAR | Status: AC
  Filled 2019-01-27: qty 2

## 2019-01-27 MED ORDER — PROPOFOL 500 MG/50ML IV EMUL
INTRAVENOUS | Status: DC | PRN
  Administered 2019-01-27: 75 ug/kg/min via INTRAVENOUS

## 2019-01-27 MED ORDER — OXYCODONE HCL 5 MG PO TABS: 5.0000 mg | ORAL_TABLET | Freq: Four times a day (QID) | ORAL | 0 refills | Status: AC | PRN

## 2019-01-27 MED ORDER — ROCURONIUM BROMIDE 10 MG/ML (PF) SYRINGE
PREFILLED_SYRINGE | INTRAVENOUS | Status: AC
  Filled 2019-01-27: qty 10

## 2019-01-27 MED ORDER — PROPOFOL 10 MG/ML IV BOLUS
INTRAVENOUS | Status: AC
  Filled 2019-01-27: qty 40

## 2019-01-27 MED ORDER — LIDOCAINE HCL (PF) 1 % IJ SOLN
INTRAMUSCULAR | Status: AC
  Filled 2019-01-27: qty 30

## 2019-01-27 MED ORDER — ONDANSETRON HCL 4 MG/2ML IJ SOLN
INTRAMUSCULAR | Status: DC | PRN
  Administered 2019-01-27: 4 mg via INTRAVENOUS

## 2019-01-27 MED ORDER — OXYCODONE HCL 5 MG/5ML PO SOLN: 5.0000 mg | Freq: Once | ORAL | Status: DC | PRN

## 2019-01-27 MED ORDER — DEXAMETHASONE SODIUM PHOSPHATE 10 MG/ML IJ SOLN
INTRAMUSCULAR | Status: DC | PRN
  Administered 2019-01-27: 10 mg via INTRAVENOUS

## 2019-01-27 MED ORDER — SCOPOLAMINE 1 MG/3DAYS TD PT72
MEDICATED_PATCH | TRANSDERMAL | Status: AC
  Filled 2019-01-27: qty 1

## 2019-01-27 MED ORDER — LIDOCAINE 2% (20 MG/ML) 5 ML SYRINGE
INTRAMUSCULAR | Status: AC
  Filled 2019-01-27: qty 5

## 2019-01-27 MED ORDER — ONDANSETRON HCL 4 MG/2ML IJ SOLN
INTRAMUSCULAR | Status: AC
  Filled 2019-01-27: qty 2

## 2019-01-27 MED ORDER — CEFAZOLIN SODIUM-DEXTROSE 2-4 GM/100ML-% IV SOLN
2.0000 g | INTRAVENOUS | Status: AC
  Administered 2019-01-27: 2 g via INTRAVENOUS
  Filled 2019-01-27: qty 100

## 2019-01-27 MED ORDER — STERILE WATER FOR IRRIGATION IR SOLN
Status: DC | PRN
  Administered 2019-01-27: 1000 mL

## 2019-01-27 MED ORDER — GABAPENTIN 300 MG PO CAPS
300.0000 mg | ORAL_CAPSULE | ORAL | Status: AC
  Administered 2019-01-27: 300 mg via ORAL
  Filled 2019-01-27: qty 1

## 2019-01-27 MED ORDER — DEXAMETHASONE SODIUM PHOSPHATE 10 MG/ML IJ SOLN
INTRAMUSCULAR | Status: AC
  Filled 2019-01-27: qty 2

## 2019-01-27 MED ORDER — FENTANYL CITRATE (PF) 100 MCG/2ML IJ SOLN: 25.0000 ug | INTRAMUSCULAR | Status: DC | PRN

## 2019-01-27 MED ORDER — PROPOFOL 10 MG/ML IV BOLUS
INTRAVENOUS | Status: DC | PRN
  Administered 2019-01-27 (×2): 20 mg via INTRAVENOUS

## 2019-01-27 SURGICAL SUPPLY — 31 items
CANISTER SUCT 3000ML PPV (MISCELLANEOUS) IMPLANT
COVER SURGICAL LIGHT HANDLE (MISCELLANEOUS) ×3 IMPLANT
COVER WAND RF STERILE (DRAPES) IMPLANT
DERMABOND ADVANCED (GAUZE/BANDAGES/DRESSINGS) ×2
DERMABOND ADVANCED .7 DNX12 (GAUZE/BANDAGES/DRESSINGS) ×1 IMPLANT
DRAPE LAPAROSCOPIC ABDOMINAL (DRAPES) IMPLANT
DRAPE LAPAROTOMY 100X72 PEDS (DRAPES) ×3 IMPLANT
DRSG TEGADERM 4X4.75 (GAUZE/BANDAGES/DRESSINGS) IMPLANT
ELECT REM PT RETURN 9FT ADLT (ELECTROSURGICAL) ×3
ELECTRODE REM PT RTRN 9FT ADLT (ELECTROSURGICAL) ×1 IMPLANT
GAUZE SPONGE 4X4 12PLY STRL (GAUZE/BANDAGES/DRESSINGS) IMPLANT
GLOVE SURG SIGNA 7.5 PF LTX (GLOVE) ×3 IMPLANT
GOWN STRL REUS W/ TWL LRG LVL3 (GOWN DISPOSABLE) ×1 IMPLANT
GOWN STRL REUS W/ TWL XL LVL3 (GOWN DISPOSABLE) ×1 IMPLANT
GOWN STRL REUS W/TWL LRG LVL3 (GOWN DISPOSABLE) ×2
GOWN STRL REUS W/TWL XL LVL3 (GOWN DISPOSABLE) ×2
KIT BASIN OR (CUSTOM PROCEDURE TRAY) ×3 IMPLANT
KIT TURNOVER KIT B (KITS) ×3 IMPLANT
MANIFOLD NEPTUNE II (INSTRUMENTS) ×3 IMPLANT
NEEDLE HYPO 25GX1X1/2 BEV (NEEDLE) ×3 IMPLANT
NS IRRIG 1000ML POUR BTL (IV SOLUTION) ×3 IMPLANT
PACK GENERAL/GYN (CUSTOM PROCEDURE TRAY) ×3 IMPLANT
PAD ARMBOARD 7.5X6 YLW CONV (MISCELLANEOUS) ×3 IMPLANT
PENCIL SMOKE EVACUATOR (MISCELLANEOUS) ×3 IMPLANT
SPECIMEN JAR SMALL (MISCELLANEOUS) ×3 IMPLANT
SUT MNCRL AB 4-0 PS2 18 (SUTURE) ×3 IMPLANT
SUT VIC AB 3-0 SH 27 (SUTURE) ×2
SUT VIC AB 3-0 SH 27XBRD (SUTURE) ×1 IMPLANT
SYR CONTROL 10ML LL (SYRINGE) ×3 IMPLANT
TOWEL GREEN STERILE (TOWEL DISPOSABLE) IMPLANT
TOWEL GREEN STERILE FF (TOWEL DISPOSABLE) ×3 IMPLANT

## 2019-01-27 NOTE — Op Note (Signed)
EXCISION MASS UPPER BACK  Procedure Note  Lynn Gregory 01/27/2019   Pre-op Diagnosis: MASS UPPER BACK     Post-op Diagnosis: same  Procedure(s): EXCISION MASS UPPER BACK (5 cm)  Surgeon(s): Coralie Keens, MD  Anesthesia: Monitor Anesthesia Care  Staff:  Circulator: Beryle Lathe, RN Scrub Person: Small, Selinda Orion, RN  Estimated Blood Loss: Minimal               Specimens: sent to path  Findings: The patient was found to have what appeared to be a 5 cm lipoma in the upper back creating the mass  Procedure: The patient was brought to the operating room and identifies the correct patient.  She was placed in the left lateral decubitus position.  Anesthesia was then induced.  Her back was then prepped and draped in usual sterile fashion.  The mass was at the midline of the upper back.  I anesthetized skin with lidocaine.  I made incision with a scalpel dissected down to the mass.  It appeared to be consistent with a lipoma which was removed piecemeal.  It was then sent to pathology for evaluation.  Hemostasis was achieved with cautery.  I anesthetized the incision further with lidocaine.  I then closed the subcutaneous tissue with interrupted 3-0 Vicryl sutures and closed the skin with a running 4-0 Monocryl.  Dermabond was then applied.  The patient tolerated procedure well.  All counts were correct at the end of procedure.  The patient was then taken in stable condition from the operating room to the recovery room          Coralie Keens   Date: 01/27/2019  Time: 12:28 PM

## 2019-01-27 NOTE — Transfer of Care (Signed)
Immediate Anesthesia Transfer of Care Note  Patient: Lynn Gregory  Procedure(s) Performed: EXCISION MASS UPPER BACK (N/A )  Patient Location: PACU  Anesthesia Type:MAC  Level of Consciousness: awake, alert  and oriented  Airway & Oxygen Therapy: Patient Spontanous Breathing and Patient connected to face mask oxygen  Post-op Assessment: Report given to RN and Post -op Vital signs reviewed and stable  Post vital signs: Reviewed and stable  Last Vitals:  Vitals Value Taken Time  BP 108/72 01/27/19 1236  Temp    Pulse 72 01/27/19 1237  Resp 23 01/27/19 1237  SpO2 100 % 01/27/19 1237  Vitals shown include unvalidated device data.  Last Pain:  Vitals:   01/27/19 1105  TempSrc:   PainSc: 0-No pain         Complications: No apparent anesthesia complications

## 2019-01-27 NOTE — Anesthesia Postprocedure Evaluation (Signed)
Anesthesia Post Note  Patient: Lynn Gregory  Procedure(s) Performed: EXCISION MASS UPPER BACK (N/A Back)     Patient location during evaluation: PACU Anesthesia Type: MAC Level of consciousness: awake and alert Pain management: pain level controlled Vital Signs Assessment: post-procedure vital signs reviewed and stable Respiratory status: spontaneous breathing, nonlabored ventilation, respiratory function stable and patient connected to nasal cannula oxygen Cardiovascular status: stable and blood pressure returned to baseline Postop Assessment: no apparent nausea or vomiting Anesthetic complications: no    Last Vitals:  Vitals:   01/27/19 1336 01/27/19 1421  BP: 128/85 128/72  Pulse: 68 71  Resp: 20 20  Temp:    SpO2: 99% 91%    Last Pain:  Vitals:   01/27/19 1421  TempSrc:   PainSc: 0-No pain                 Nikeisha Klutz COKER

## 2019-01-27 NOTE — Interval H&P Note (Signed)
History and Physical Interval Note: no change in H and P  01/27/2019 11:18 AM  Lynn Gregory  has presented today for surgery, with the diagnosis of MASS UPPER BACK.  The various methods of treatment have been discussed with the patient and family. After consideration of risks, benefits and other options for treatment, the patient has consented to  Procedure(s): EXCISION MASS UPPER BACK (N/A) as a surgical intervention.  The patient's history has been reviewed, patient examined, no change in status, stable for surgery.  I have reviewed the patient's chart and labs.  Questions were answered to the patient's satisfaction.     Coralie Keens

## 2019-01-27 NOTE — Anesthesia Procedure Notes (Signed)
Procedure Name: MAC Date/Time: 01/27/2019 11:59 AM Performed by: Candis Shine, CRNA Pre-anesthesia Checklist: Patient identified, Suction available, Emergency Drugs available, Patient being monitored and Timeout performed Patient Re-evaluated:Patient Re-evaluated prior to induction Oxygen Delivery Method: Simple face mask Dental Injury: Teeth and Oropharynx as per pre-operative assessment

## 2019-01-27 NOTE — Discharge Instructions (Signed)
Ok to shower starting tomorrow  Ice pack, tylenol, ibuprofen also for pain  No vigorous activity for 1 week

## 2019-01-28 ENCOUNTER — Encounter (HOSPITAL_COMMUNITY): Payer: Self-pay | Admitting: Surgery

## 2020-05-05 ENCOUNTER — Other Ambulatory Visit: Payer: Self-pay | Admitting: Specialist

## 2020-05-05 DIAGNOSIS — Z1231 Encounter for screening mammogram for malignant neoplasm of breast: Secondary | ICD-10-CM

## 2020-06-13 ENCOUNTER — Other Ambulatory Visit: Payer: Self-pay | Admitting: Family Medicine

## 2020-06-13 DIAGNOSIS — D849 Immunodeficiency, unspecified: Secondary | ICD-10-CM

## 2020-06-13 DIAGNOSIS — Z940 Kidney transplant status: Secondary | ICD-10-CM

## 2020-06-15 ENCOUNTER — Other Ambulatory Visit: Payer: Self-pay

## 2020-06-15 ENCOUNTER — Ambulatory Visit
Admission: RE | Admit: 2020-06-15 | Discharge: 2020-06-15 | Disposition: A | Discharge status: Home / Self Care | Payer: Medicare HMO | Source: Ambulatory Visit | Attending: Specialist | Admitting: Specialist

## 2020-06-15 DIAGNOSIS — Z1231 Encounter for screening mammogram for malignant neoplasm of breast: Secondary | ICD-10-CM

## 2020-08-23 ENCOUNTER — Other Ambulatory Visit: Payer: Self-pay | Admitting: Nephrology

## 2020-08-23 DIAGNOSIS — Z940 Kidney transplant status: Secondary | ICD-10-CM

## 2020-08-23 DIAGNOSIS — R809 Proteinuria, unspecified: Secondary | ICD-10-CM

## 2020-08-23 DIAGNOSIS — I10 Essential (primary) hypertension: Secondary | ICD-10-CM

## 2020-08-23 DIAGNOSIS — N1832 Chronic kidney disease, stage 3b: Secondary | ICD-10-CM

## 2020-09-04 ENCOUNTER — Other Ambulatory Visit: Payer: Medicare HMO

## 2020-09-07 ENCOUNTER — Ambulatory Visit
Admission: RE | Admit: 2020-09-07 | Discharge: 2020-09-07 | Disposition: A | Discharge status: Home / Self Care | Payer: Medicare HMO | Source: Ambulatory Visit | Attending: Nephrology | Admitting: Nephrology

## 2020-09-07 DIAGNOSIS — R809 Proteinuria, unspecified: Secondary | ICD-10-CM

## 2020-09-07 DIAGNOSIS — Z940 Kidney transplant status: Secondary | ICD-10-CM

## 2020-09-07 DIAGNOSIS — N1832 Chronic kidney disease, stage 3b: Secondary | ICD-10-CM

## 2020-09-07 DIAGNOSIS — I10 Essential (primary) hypertension: Secondary | ICD-10-CM

## 2021-02-12 ENCOUNTER — Other Ambulatory Visit: Payer: Self-pay | Admitting: Nurse Practitioner

## 2021-02-12 DIAGNOSIS — N6489 Other specified disorders of breast: Secondary | ICD-10-CM

## 2021-02-19 ENCOUNTER — Ambulatory Visit
Admission: RE | Admit: 2021-02-19 | Discharge: 2021-02-19 | Disposition: A | Discharge status: Home / Self Care | Payer: Medicare HMO | Source: Ambulatory Visit | Attending: Nurse Practitioner | Admitting: Nurse Practitioner

## 2021-02-19 ENCOUNTER — Other Ambulatory Visit: Payer: Self-pay

## 2021-02-19 DIAGNOSIS — N6489 Other specified disorders of breast: Secondary | ICD-10-CM

## 2021-02-24 IMAGING — MG DIGITAL SCREENING BILAT W/ TOMO W/ CAD
6 of 12 series · 6 of 36 positions shown · non-contrast
Comparison: Previous exam(s).

CLINICAL DATA: Screening.

EXAM:
DIGITAL SCREENING BILATERAL MAMMOGRAM WITH TOMO AND CAD

[L CC synth-2D]
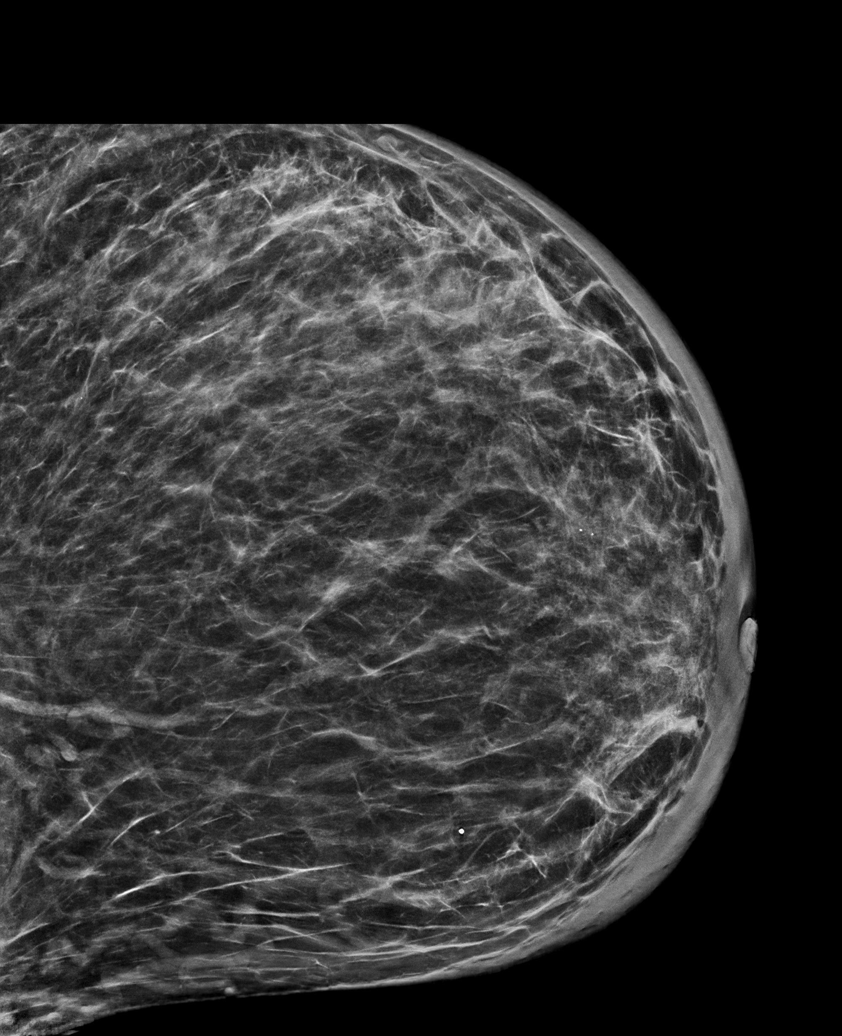

[R CC synth-2D (1 of 2)]
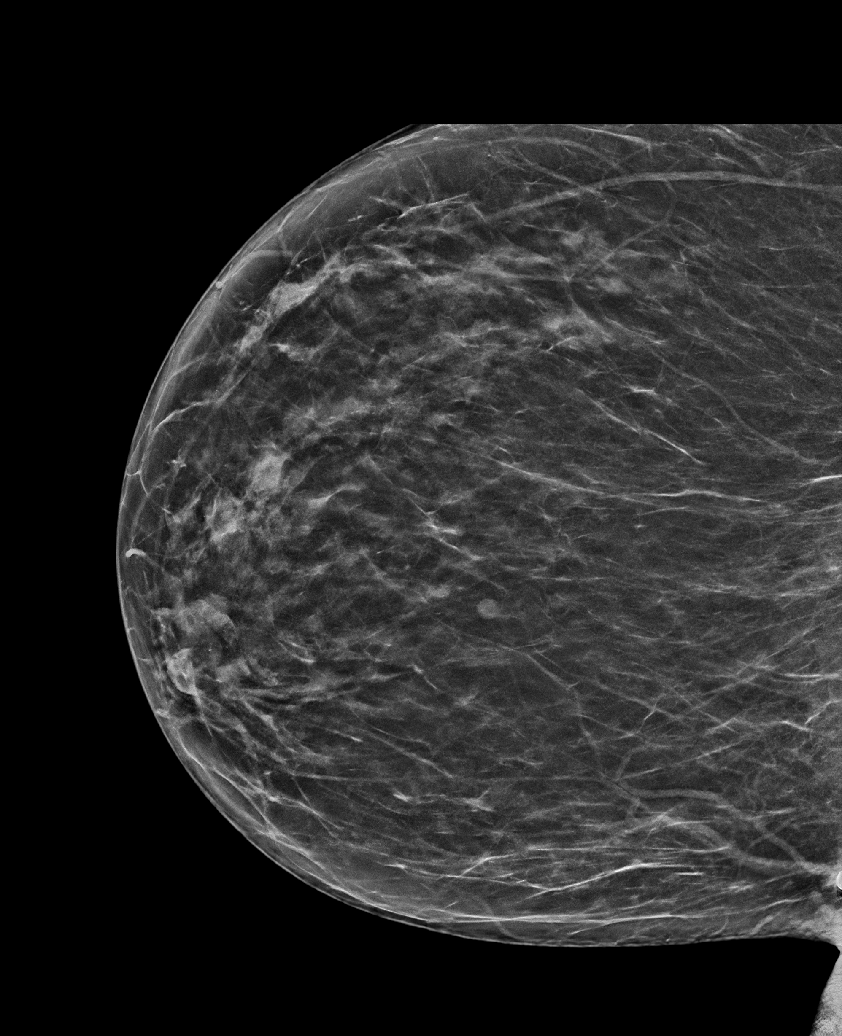

[R MLO synth-2D]
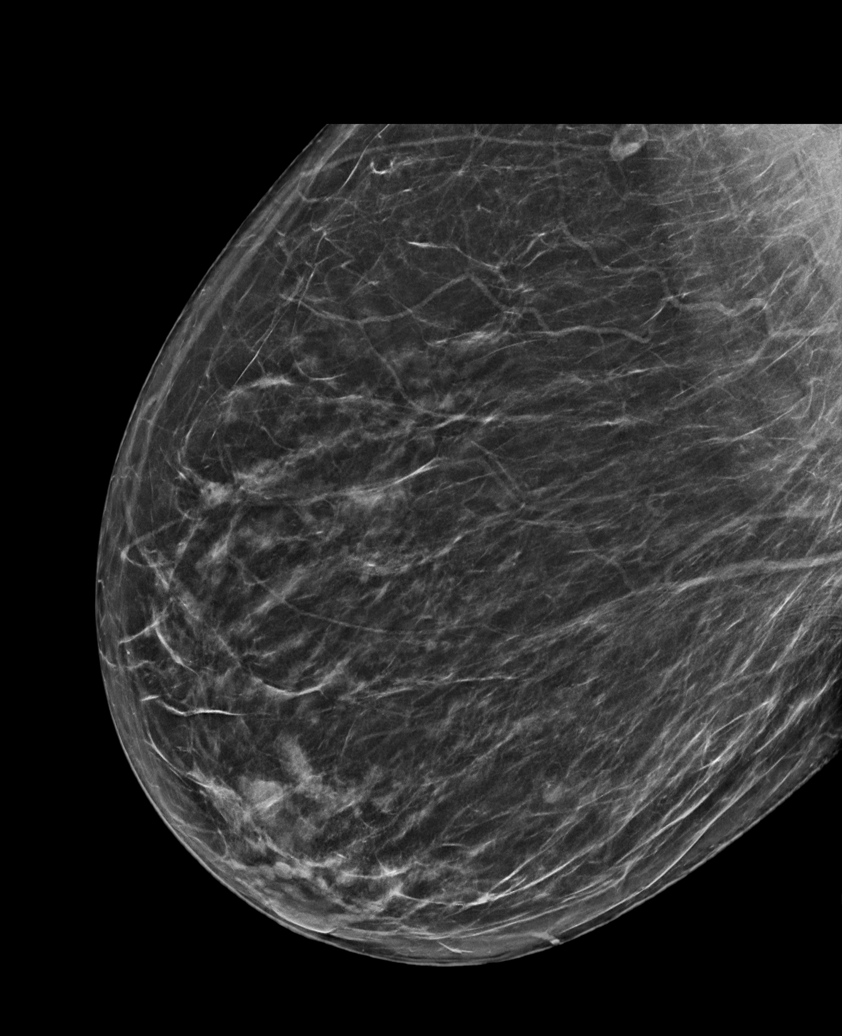

[L MLO synth-2D (1 of 2)]
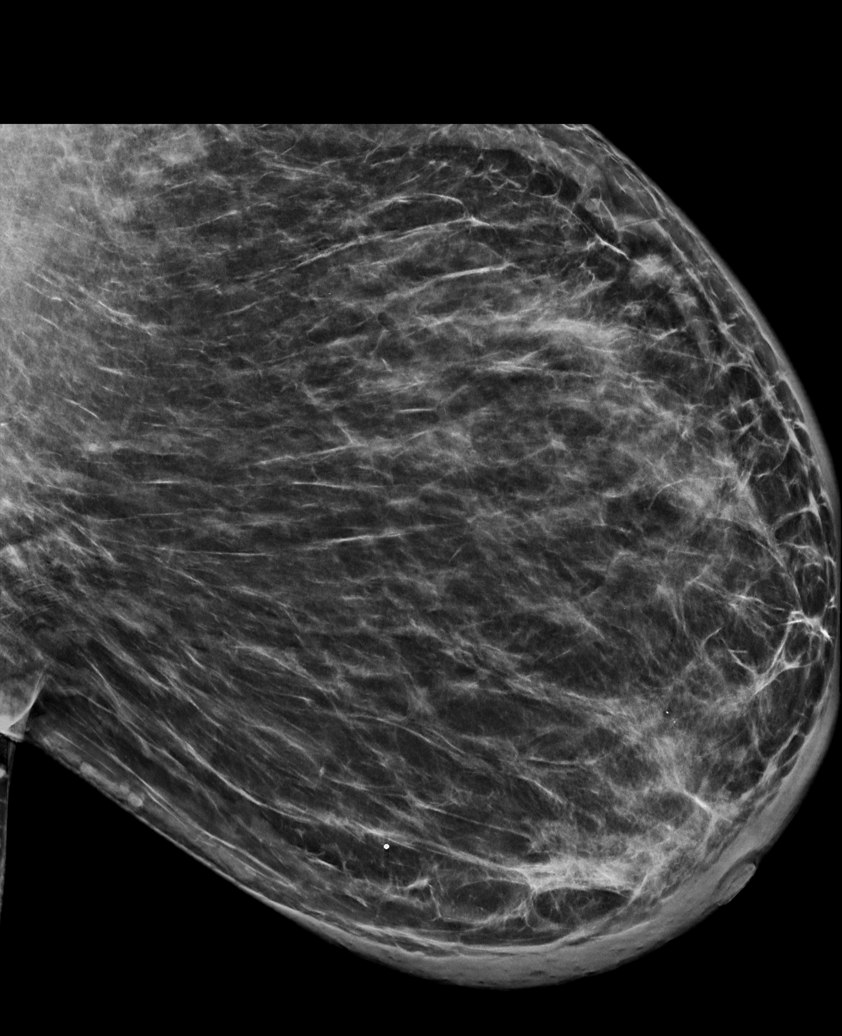

[L MLO synth-2D (2 of 2)]
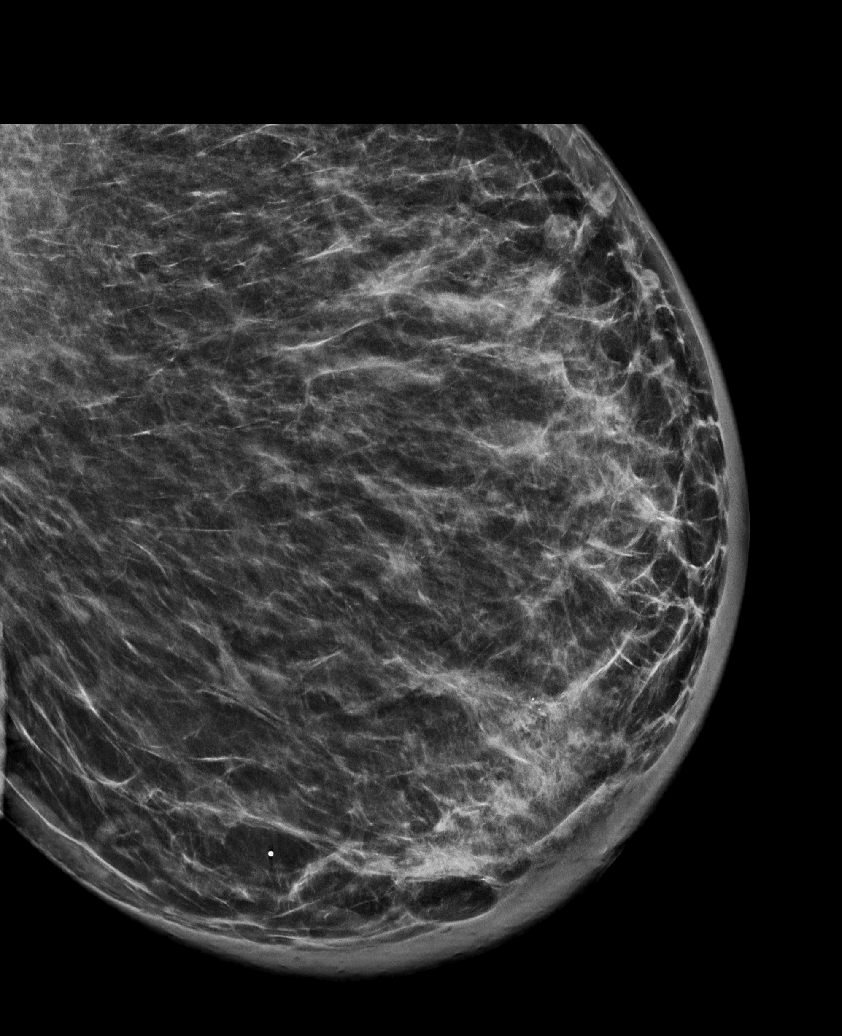

[R CC synth-2D (2 of 2)]
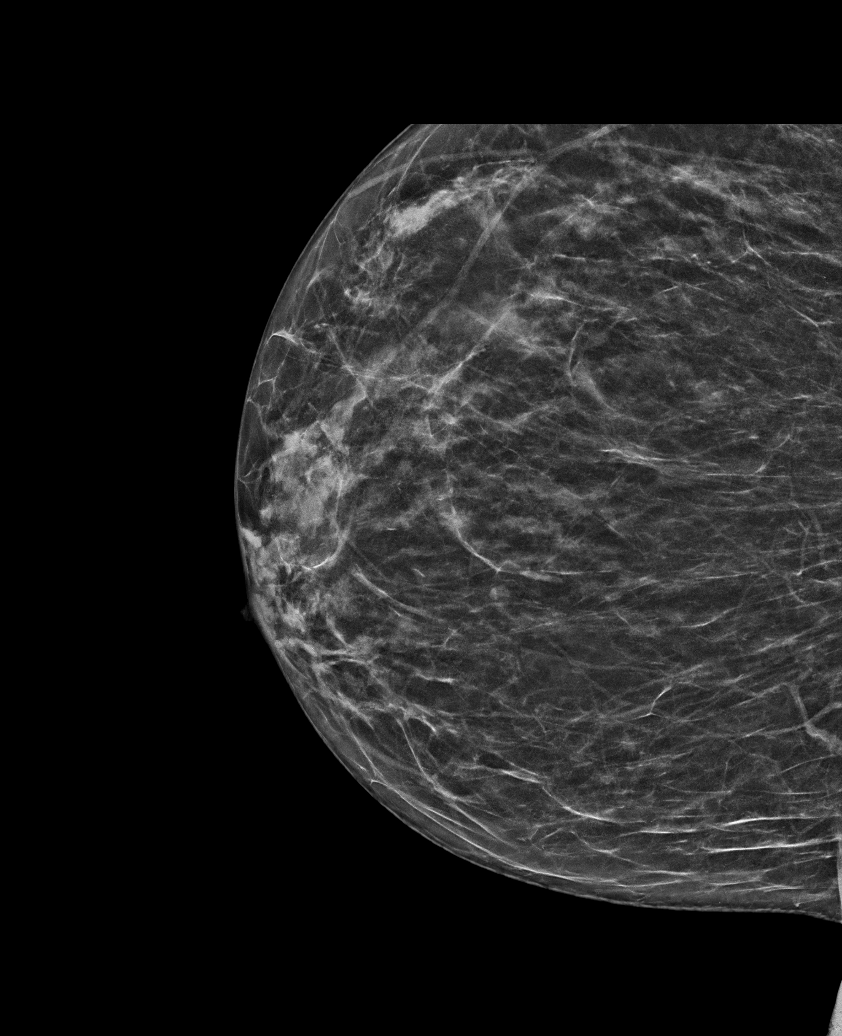

[6 of 36 positions shown; findings below may reference images not displayed]

ACR Breast Density Category b: There are scattered areas of
fibroglandular density.
FINDINGS: There are no findings suspicious for malignancy. Images were
processed with CAD.
IMPRESSION: No mammographic evidence of malignancy. A result letter of this
screening mammogram will be mailed directly to the patient.

RECOMMENDATION:
Screening mammogram in one year. (Code:[id number])

BI-RADS CATEGORY  1: Negative.

## 2021-03-31 DEATH — deceased
